# Patient Record
Sex: Female | Born: 1990 | Race: Black or African American | Hispanic: No | Marital: Single | Smoking: Never smoker
Health system: Southern US, Community
[De-identification: ages and names within clinical notes are randomized; demographics above are authoritative.]

## PROBLEM LIST (undated history)

## (undated) HISTORY — PX: TONSILLECTOMY: SUR1361

---

## 2010-11-17 ENCOUNTER — Emergency Department (HOSPITAL_COMMUNITY)
Admission: EM | Admit: 2010-11-17 | Discharge: 2010-11-17 | Payer: Self-pay | Source: Home / Self Care | Admitting: Family Medicine

## 2011-09-09 ENCOUNTER — Encounter: Payer: Self-pay | Admitting: Emergency Medicine

## 2011-09-09 ENCOUNTER — Emergency Department (INDEPENDENT_AMBULATORY_CARE_PROVIDER_SITE_OTHER)
Admission: EM | Admit: 2011-09-09 | Discharge: 2011-09-09 | Disposition: A | Discharge status: Home / Self Care | Payer: BC Managed Care – PPO | Source: Home / Self Care | Attending: Family Medicine | Admitting: Family Medicine

## 2011-09-09 DIAGNOSIS — N760 Acute vaginitis: Secondary | ICD-10-CM

## 2011-09-09 LAB — POCT URINALYSIS DIP (DEVICE)
Bilirubin Urine: NEGATIVE
Glucose, UA: NEGATIVE mg/dL
Hgb urine dipstick: NEGATIVE
Ketones, ur: NEGATIVE mg/dL
Leukocytes, UA: NEGATIVE
Nitrite: NEGATIVE
Protein, ur: 30 mg/dL — AB
Specific Gravity, Urine: 1.025 (ref 1.005–1.030)
Urobilinogen, UA: 0.2 mg/dL (ref 0.0–1.0)
pH: 7 (ref 5.0–8.0)

## 2011-09-09 LAB — POCT PREGNANCY, URINE: Preg Test, Ur: NEGATIVE

## 2011-09-09 LAB — WET PREP, GENITAL
Trich, Wet Prep: NONE SEEN
Yeast Wet Prep HPF POC: NONE SEEN

## 2011-09-09 MED ORDER — METRONIDAZOLE 500 MG PO TABS: 500.0000 mg | ORAL_TABLET | Freq: Two times a day (BID) | ORAL | Status: AC

## 2011-09-09 NOTE — ED Notes (Signed)
Pt here with c/o yeast infection sx with right side labia cluster of pink bumps that started yesterday.c/o irritation but no pain.pt tried to pop pimple with clear drainage.lmp 2 wks ago.

## 2011-09-09 NOTE — ED Provider Notes (Signed)
History     CSN: [id number] Arrival date & time: 09/09/2011  1:31 PM   First MD Initiated Contact with Patient 09/09/11 1337      Chief Complaint  Patient presents with  . Vaginitis  . Blister    (Consider location/radiation/quality/duration/timing/severity/associated sxs/prior treatment) Patient is a 20 y.o. female presenting with vaginal discharge. The history is provided by the patient.  Vaginal Discharge This is a new problem. The current episode started yesterday. The problem occurs constantly.  Di scribed as thick and white. Also reports some tiny blister. States she wore a thong yesterday and then noted tiny blisters with itching. No prior episodes. No tx pta. No recent antibiotics.   History reviewed. No pertinent past medical history.  History reviewed. No pertinent past surgical history.  History reviewed. No pertinent family history.  History  Substance Use Topics  . Smoking status: Never Smoker   . Smokeless tobacco: Not on file  . Alcohol Use: Yes    OB History    Grav Para Term Preterm Abortions TAB SAB Ect Mult Living                  Review of Systems  Constitutional: Negative.   HENT: Negative.   Respiratory: Negative.   Cardiovascular: Negative.   Gastrointestinal: Negative.   Genitourinary: Positive for vaginal discharge.    Allergies  Review of patient's allergies indicates no known allergies.  Home Medications   Current Outpatient Rx  Name Route Sig Dispense Refill  . NORGESTIMATE-ETH ESTRADIOL 0.25-35 MG-MCG PO TABS Oral Take 1 tablet by mouth daily.      Marland Kitchen METRONIDAZOLE 500 MG PO TABS Oral Take 1 tablet (500 mg total) by mouth 2 (two) times daily. 14 tablet 0    BP 128/84  Pulse 84  Temp(Src) 98.2 F (36.8 C) (Oral)  Resp 18  SpO2 98%  LMP 08/26/2011  Physical Exam  Nursing note and vitals reviewed. Constitutional: She appears well-developed and well-nourished. No distress.  Cardiovascular: Normal rate.   Pulmonary/Chest:  Effort normal.  Genitourinary:       Pelvic exam with female nursing personal Noreene Larsson assisting reveals very tiny lesion if any. No erythema. Thick discharge. Samples collected. No cmt.     ED Course  Procedures (including critical care time)  Labs Reviewed  POCT URINALYSIS DIP (DEVICE) - Abnormal; Notable for the following:    Protein, ur 30 (*)    All other components within normal limits  GC/CHLAMYDIA PROBE AMP, GENITAL  WET PREP, GENITAL  HERPES SIMPLEX VIRUS CULTURE  POCT PREGNANCY, URINE  POCT URINALYSIS DIPSTICK   No results found.   1. Vaginitis       MDM          Randa Spike, MD 09/09/11 [phone]

## 2011-09-10 LAB — GC/CHLAMYDIA PROBE AMP, GENITAL
Chlamydia, DNA Probe: NEGATIVE
GC Probe Amp, Genital: NEGATIVE

## 2011-09-13 ENCOUNTER — Telehealth (HOSPITAL_COMMUNITY): Payer: Self-pay | Admitting: Family Medicine

## 2011-09-13 LAB — HERPES SIMPLEX VIRUS CULTURE: Culture: DETECTED

## 2011-09-13 MED ORDER — ACYCLOVIR 400 MG PO TABS: 400.0000 mg | ORAL_TABLET | Freq: Every day | ORAL | Status: AC

## 2011-09-13 NOTE — ED Notes (Addendum)
Pt. called for her lab results. Pt. verified x 2 and given results.  She asked what about the Herpes?  It resulted @ 1121 after labs printed this AM. Pt. given result.  Reviewed medications and pt. needs treatment.  Order obtained from Dr. Juanetta Gosling for Acyclovir.  See telephone encounter for complete order, pt. instructions where Rx. was called. Vassie Moselle 09/13/2011

## 2011-09-25 ENCOUNTER — Emergency Department (HOSPITAL_COMMUNITY)
Admission: EM | Admit: 2011-09-25 | Discharge: 2011-09-25 | Disposition: A | Discharge status: Home / Self Care | Payer: BC Managed Care – PPO | Source: Home / Self Care

## 2013-02-15 ENCOUNTER — Encounter (HOSPITAL_COMMUNITY): Payer: Self-pay | Admitting: Emergency Medicine

## 2013-02-15 ENCOUNTER — Emergency Department (HOSPITAL_COMMUNITY)
Admission: EM | Admit: 2013-02-15 | Discharge: 2013-02-15 | Disposition: A | Discharge status: Home / Self Care | Payer: BC Managed Care – PPO | Source: Home / Self Care | Attending: Emergency Medicine | Admitting: Emergency Medicine

## 2013-02-15 DIAGNOSIS — J039 Acute tonsillitis, unspecified: Secondary | ICD-10-CM

## 2013-02-15 LAB — POCT RAPID STREP A: Streptococcus, Group A Screen (Direct): NEGATIVE

## 2013-02-15 MED ORDER — PENICILLIN V POTASSIUM 500 MG PO TABS: 500.0000 mg | ORAL_TABLET | Freq: Three times a day (TID) | ORAL | Status: AC

## 2013-02-15 NOTE — ED Provider Notes (Signed)
Chief Complaint:   Chief Complaint  Patient presents with  . Sore Throat    left side of throat there is a white patch pt states "feels like something is stuck in throat"    History of Present Illness:   Misty Cameron is a 22 year old female who has had a one-week history of sore throat on the left with a white spot, nasal congestion, rhinorrhea, pain in her right ear, swollen glands, and a slight cough. She denies any fever, chills, headache, GI symptoms, or history of exposure to strep or to mono. She [facility] and had testing for STDs. She does not think she was tested for strep. She was told they could not treat her therefore anything.  Review of Systems:  Other than as noted above, the patient denies any of the following symptoms. Systemic:  No fever, chills, sweats, fatigue, myalgias, headache, or anorexia. Eye:  No redness, pain or drainage. ENT:  No earache, ear congestion, nasal congestion, sneezing, rhinorrhea, sinus pressure, sinus pain, or post nasal drip. Lungs:  No cough, sputum production, wheezing, shortness of breath, or chest pain. GI:  No abdominal pain, nausea, vomiting, or diarrhea. Skin:  No rash or itching.  PMFSH:  Past medical history, family history, social history, meds, allergies, and nurse's notes were reviewed.  There is no known exposure to strep or mono.  No prior history of step or mono.  The patient denies use of tobacco.   Physical Exam:   Vital signs:  BP 110/74  Pulse 55  Temp(Src) 98.5 F (36.9 C) (Oral)  Resp 16  LMP 02/15/2013 General:  Alert, in no distress. Eye:  No conjunctival injection or drainage. Lids were normal. ENT:  TMs and canals were normal, without erythema or inflammation.  Nasal mucosa was clear and uncongested, without drainage.  Mucous membranes were moist.  Exam of pharynx reveals the left tonsil to be enlarged and erythematous with one spot of white exudate.  There were no oral ulcerations or  lesions. Neck:  Supple, no adenopathy, tenderness or mass. Lungs:  No respiratory distress.  Lungs were clear to auscultation, without wheezes, rales or rhonchi.  Breath sounds were clear and equal bilaterally.  Heart:  Regular rhythm, without gallops, murmers or rubs. Skin:  Clear, warm, and dry, without rash or lesions.  Labs:   Results for orders placed during the hospital encounter of 02/15/13  POCT RAPID STREP A ([facility] ONLY)      Result Value Range   Streptococcus, Group A Screen (Direct) NEGATIVE  NEGATIVE   She also had a Monospot which was negative.  Assessment:  The encounter diagnosis was Tonsillitis.  No laboratory evidence of strep or mono, but symptoms are suspicious for strep, so will treat as such, return if no better in 3 or 4 days.  Plan:   1.  The following meds were prescribed:   Discharge Medication List as of 02/15/2013  5:37 PM    START taking these medications   Details  penicillin v potassium (VEETID) 500 MG tablet Take 1 tablet (500 mg total) by mouth 3 (three) times daily., Starting 02/15/2013, Until Discontinued, Normal       2.  The patient was instructed in symptomatic care including hot saline gargles, throat lozenges, infectious precautions, and need to trade out toothbrush. Handouts were given. 3.  The patient was told to return if becoming worse in any way, if no better in 3 or 4 days, and given  some red flag symptoms such as fever or difficulty breathing or swallowing or worsening pain that would indicate earlier return.    Reuben Likes, MD 02/15/13 [phone]

## 2013-02-15 NOTE — ED Notes (Addendum)
Pt c/o sore throat that mainly hurts on the left side. There is a white patch noted on the left side of the throat. Pt states "feels like something is stuck"  Pt has been doing salt water gargles with no relief. Pt was recently seen at health department and a culture was done but has not come back yet.   Pt denies fever, n/v/d

## 2013-02-16 LAB — POCT INFECTIOUS MONO SCREEN: Mono Screen: NEGATIVE

## 2013-08-21 ENCOUNTER — Emergency Department (HOSPITAL_COMMUNITY)
Admission: EM | Admit: 2013-08-21 | Discharge: 2013-08-21 | Disposition: A | Discharge status: Home / Self Care | Payer: BC Managed Care – PPO | Source: Home / Self Care | Attending: Family Medicine | Admitting: Family Medicine

## 2013-08-21 ENCOUNTER — Encounter (HOSPITAL_COMMUNITY): Payer: Self-pay | Admitting: Emergency Medicine

## 2013-08-21 DIAGNOSIS — J029 Acute pharyngitis, unspecified: Secondary | ICD-10-CM

## 2013-08-21 DIAGNOSIS — J069 Acute upper respiratory infection, unspecified: Secondary | ICD-10-CM

## 2013-08-21 LAB — POCT INFECTIOUS MONO SCREEN: Mono Screen: NEGATIVE

## 2013-08-21 LAB — POCT RAPID STREP A: Streptococcus, Group A Screen (Direct): NEGATIVE

## 2013-08-21 MED ORDER — CHLORPHENIRAMINE-PSE-IBUPROFEN 2-30-200 MG PO TABS: ORAL_TABLET | ORAL | Status: AC

## 2013-08-21 MED ORDER — METHYLPREDNISOLONE 4 MG PO KIT: PACK | ORAL | Status: AC

## 2013-08-21 NOTE — ED Notes (Signed)
C/o sore throat, lower neck soreness, productive cough with green sputum. otc meds taken no relief. Denies fever, n/v/d

## 2013-08-21 NOTE — ED Provider Notes (Signed)
CSN: [id number]     Arrival date & time 08/21/13  1122 History   First MD Initiated Contact with Patient 08/21/13 1307     Chief Complaint  Patient presents with  . Sore Throat    x 3 days. no relief with otc meds.    (Consider location/radiation/quality/duration/timing/severity/associated sxs/prior Treatment) HPI Comments: 22 year old female presents complaining of sore throat, fatigue, anterior neck soreness, and productive cough. This is been present for a few days, gradually worsening. She is also concerned because her uvula "looks like jelly." She wonders if this could possibly be an STD. She denies fever, chills, vaginal discharge, pelvic pain.  Patient is a 22 y.o. female presenting with pharyngitis.  Sore Throat Pertinent negatives include no chest pain, no abdominal pain and no shortness of breath.    History reviewed. No pertinent past medical history. Past Surgical History  Procedure Laterality Date  . Tonsillectomy     History reviewed. No pertinent family history. History  Substance Use Topics  . Smoking status: Never Smoker   . Smokeless tobacco: Not on file  . Alcohol Use: Yes   OB History   Grav Para Term Preterm Abortions TAB SAB Ect Mult Living                 Review of Systems  Constitutional: Negative for fever and chills.  HENT: Positive for ear pain (itching), rhinorrhea and sore throat. Negative for dental problem, sinus pressure and trouble swallowing.   Eyes: Negative for visual disturbance.  Respiratory: Positive for cough. Negative for shortness of breath.   Cardiovascular: Negative for chest pain, palpitations and leg swelling.  Gastrointestinal: Negative for nausea, vomiting and abdominal pain.  Endocrine: Negative for polydipsia and polyuria.  Genitourinary: Negative for dysuria, urgency and frequency.  Musculoskeletal: Positive for back pain (mid upper back). Negative for arthralgias and myalgias.  Skin: Negative for rash.  Neurological:  Negative for dizziness, weakness and light-headedness.    Allergies  Review of patient's allergies indicates no known allergies.  Home Medications   Current Outpatient Rx  Name  Route  Sig  Dispense  Refill  . Chlorpheniramine-PSE-Ibuprofen (ADVIL ALLERGY SINUS) 2-30-200 MG TABS      1-2 tabs PO Q4-6 hrs PRN   50 each   1   . methylPREDNISolone (MEDROL DOSEPAK) 4 MG tablet      follow package directions   21 tablet   0     Dispense as written.   . norgestimate-ethinyl estradiol (ORTHO-CYCLEN,SPRINTEC,PREVIFEM) 0.25-35 MG-MCG tablet   Oral   Take 1 tablet by mouth daily.           . penicillin v potassium (VEETID) 500 MG tablet   Oral   Take 1 tablet (500 mg total) by mouth 3 (three) times daily.   30 tablet   0    BP 110/73  Pulse 63  Temp(Src) 98.3 F (36.8 C) (Oral)  Resp 16  SpO2 100%  LMP 07/23/2013 Physical Exam  Nursing note and vitals reviewed. Constitutional: She is oriented to person, place, and time. Vital signs are normal. She appears well-developed and well-nourished. She appears ill. No distress.  HENT:  Head: Normocephalic and atraumatic.  Mouth/Throat: Uvula is midline. Uvula swelling present. Posterior oropharyngeal erythema present. No oropharyngeal exudate.  Eyes: EOM are normal. Pupils are equal, round, and reactive to light.  Neck: Normal range of motion. Neck supple.  Pulmonary/Chest: Effort normal. No respiratory distress.  Lymphadenopathy:    She has cervical adenopathy (right tonsillar).  Neurological: She is alert and oriented to person, place, and time. She has normal strength. Coordination normal.  Skin: Skin is warm and dry. No rash noted. She is not diaphoretic.  Psychiatric: She has a normal mood and affect. Judgment normal.    ED Course  Procedures (including critical care time) Labs Review Labs Reviewed  CULTURE, GROUP A STREP  POCT RAPID STREP A ([facility] ONLY)  POCT INFECTIOUS MONO SCREEN   Imaging Review No  results found.    MDM   1. URI (upper respiratory infection)   2. Pharyngitis, acute    Rapid strep is negative, this is most likely Viral pharyngitis. She expresses some concern about STDs in her throat do to some of her recent activities. I have offered her treatment for this assuming it is an STD but she declines at this time because she does not want to get an injection. if it does not get better she will come back. She declines any testing at this time.   Meds ordered this encounter  Medications  . methylPREDNISolone (MEDROL DOSEPAK) 4 MG tablet    Sig: follow package directions    Dispense:  21 tablet    Refill:  0    Order Specific Question:  Supervising Provider    Answer:  Linna Hoff [phone]  . Chlorpheniramine-PSE-Ibuprofen (ADVIL ALLERGY SINUS) 2-30-200 MG TABS    Sig: 1-2 tabs PO Q4-6 hrs PRN    Dispense:  50 each    Refill:  1    Order Specific Question:  Supervising Provider    Answer:  Bradd Canary D [[id number]]     Graylon Good, PA-C 08/21/13 2043

## 2013-08-23 LAB — CULTURE, GROUP A STREP

## 2013-08-23 NOTE — ED Provider Notes (Signed)
Medical screening examination/treatment/procedure(s) were performed by a resident physician or non-physician practitioner and as the supervising physician I was immediately available for consultation/collaboration.  Evan Corey, MD   Evan S Corey, MD 08/23/13 0755 

## 2013-11-30 ENCOUNTER — Encounter (HOSPITAL_COMMUNITY): Payer: Self-pay | Admitting: Emergency Medicine

## 2013-11-30 ENCOUNTER — Other Ambulatory Visit (HOSPITAL_COMMUNITY)
Admission: RE | Admit: 2013-11-30 | Discharge: 2013-11-30 | Disposition: A | Discharge status: Home / Self Care | Payer: BC Managed Care – PPO | Source: Ambulatory Visit | Attending: Family Medicine | Admitting: Family Medicine

## 2013-11-30 ENCOUNTER — Emergency Department (INDEPENDENT_AMBULATORY_CARE_PROVIDER_SITE_OTHER)
Admission: EM | Admit: 2013-11-30 | Discharge: 2013-11-30 | Disposition: A | Discharge status: Home / Self Care | Payer: BC Managed Care – PPO | Source: Home / Self Care | Attending: Family Medicine | Admitting: Family Medicine

## 2013-11-30 DIAGNOSIS — B351 Tinea unguium: Secondary | ICD-10-CM

## 2013-11-30 DIAGNOSIS — Z113 Encounter for screening for infections with a predominantly sexual mode of transmission: Secondary | ICD-10-CM | POA: Insufficient documentation

## 2013-11-30 DIAGNOSIS — N760 Acute vaginitis: Secondary | ICD-10-CM | POA: Insufficient documentation

## 2013-11-30 DIAGNOSIS — N898 Other specified noninflammatory disorders of vagina: Secondary | ICD-10-CM

## 2013-11-30 LAB — POCT URINALYSIS DIP (DEVICE)
Bilirubin Urine: NEGATIVE
Glucose, UA: NEGATIVE mg/dL
Hgb urine dipstick: NEGATIVE
Ketones, ur: NEGATIVE mg/dL
Leukocytes, UA: NEGATIVE
Nitrite: NEGATIVE
Protein, ur: NEGATIVE mg/dL
Specific Gravity, Urine: >=1.03 (ref 1.005–1.030)
Urobilinogen, UA: 0.2 mg/dL (ref 0.0–1.0)
pH: 7 (ref 5.0–8.0)

## 2013-11-30 LAB — POCT PREGNANCY, URINE: Preg Test, Ur: NEGATIVE

## 2013-11-30 MED ORDER — TERBINAFINE HCL 250 MG PO TABS: 250.0000 mg | ORAL_TABLET | Freq: Every day | ORAL | Status: AC

## 2013-11-30 MED ORDER — METRONIDAZOLE 500 MG PO TABS: 500.0000 mg | ORAL_TABLET | Freq: Two times a day (BID) | ORAL | Status: AC

## 2013-11-30 NOTE — ED Provider Notes (Signed)
CSN: [id number]     Arrival date & time 11/30/13  0820 History   First MD Initiated Contact with Patient 11/30/13 [phone]     Chief Complaint  Patient presents with  . Vaginal Discharge    Patient is a 23 y.o. female presenting with vaginal discharge. The history is provided by the patient.  Vaginal Discharge Quality:  Thick and malodorous Severity:  Moderate Onset quality:  Gradual Duration:  2 days Timing:  Constant Progression:  Worsening Chronicity:  Recurrent Relieved by:  None tried Ineffective treatments:  None tried Associated symptoms: no abdominal pain, no dysuria, no fever, no genital lesions, no urinary frequency, no vaginal itching and no vomiting   Risk factors: STI   Pt presents w/ c/o 2 day h/o malodorous vag d/c. Admits to unprotected intercourse w/ one partner. LMP 11/18/2013 and was normal. Pt has h/o chlamydia, genital herpes and recurrent BV. She has been concerned because the "fishy"odor is embarrassing but denies any vag pain or lower abd pain. Denies fever. Pt is on OC's. Denies N/V/D.   History reviewed. No pertinent past medical history. Past Surgical History  Procedure Laterality Date  . Tonsillectomy     History reviewed. No pertinent family history. History  Substance Use Topics  . Smoking status: Never Smoker   . Smokeless tobacco: Not on file  . Alcohol Use: Yes   OB History   Grav Para Term Preterm Abortions TAB SAB Ect Mult Living                 Review of Systems  Constitutional: Negative for fever.  Gastrointestinal: Negative for vomiting and abdominal pain.  Genitourinary: Positive for vaginal discharge. Negative for dysuria.  All other systems reviewed and are negative.    Allergies  Review of patient's allergies indicates no known allergies.  Home Medications   Current Outpatient Rx  Name  Route  Sig  Dispense  Refill  . norgestimate-ethinyl estradiol (ORTHO-CYCLEN,SPRINTEC,PREVIFEM) 0.25-35 MG-MCG tablet   Oral   Take 1 tablet  by mouth daily.           . Chlorpheniramine-PSE-Ibuprofen (ADVIL ALLERGY SINUS) 2-30-200 MG TABS      1-2 tabs PO Q4-6 hrs PRN   50 each   1   . methylPREDNISolone (MEDROL DOSEPAK) 4 MG tablet      follow package directions   21 tablet   0     Dispense as written.   . metroNIDAZOLE (FLAGYL) 500 MG tablet   Oral   Take 1 tablet (500 mg total) by mouth 2 (two) times daily.   14 tablet   0   . penicillin v potassium (VEETID) 500 MG tablet   Oral   Take 1 tablet (500 mg total) by mouth 3 (three) times daily.   30 tablet   0   . terbinafine (LAMISIL) 250 MG tablet   Oral   Take 1 tablet (250 mg total) by mouth daily. For 12 weeks   180 tablet   1    BP 100/54  Pulse 58  Temp(Src) 98.2 F (36.8 C) (Oral)  Resp 16  SpO2 98% Physical Exam  Constitutional: She is oriented to person, place, and time. She appears well-developed and well-nourished.  HENT:  Head: Normocephalic and atraumatic.  Eyes: Conjunctivae are normal.  Neck: Neck supple.  Cardiovascular: Normal rate.   Pulmonary/Chest: Effort normal.  Abdominal: Hernia confirmed negative in the right inguinal area and confirmed negative in the left inguinal area.  Genitourinary: Vagina normal  and uterus normal. No labial fusion. There is no rash, tenderness, lesion or injury on the right labia. There is no rash, tenderness, lesion or injury on the left labia. Cervix exhibits no motion tenderness, no discharge and no friability. Right adnexum displays no mass, no tenderness and no fullness. Left adnexum displays no mass, no tenderness and no fullness.  Pelvic exam completely unremarkable. Do not appreciate malodorous discharge.  Musculoskeletal: Normal range of motion.  Lymphadenopathy:       Right: No inguinal adenopathy present.       Left: No inguinal adenopathy present.  Neurological: She is alert and oriented to person, place, and time.  Skin: Skin is warm and dry.  Psychiatric: She has a normal mood and  affect.    ED Course  Pelvic exam Date/Time: 11/30/2013 10:30 AM Performed by: Jeryl Columbia Authorized by: Jeryl Columbia Consent: Verbal consent obtained. written consent obtained. Risks and benefits: risks, benefits and alternatives were discussed Consent given by: patient Patient understanding: patient states understanding of the procedure being performed Required items: required blood products, implants, devices, and special equipment available Patient identity confirmed: verbally with patient and arm band Local anesthesia used: no Patient sedated: no Patient tolerance: Patient tolerated the procedure well with no immediate complications.   (including critical care time) Labs Review Labs Reviewed  POCT PREGNANCY, URINE  POCT URINALYSIS DIP (DEVICE)  CERVICOVAGINAL ANCILLARY ONLY   Imaging Review No results found.    MDM   1. Toenail fungus   2. Vaginal discharge    2 day h/o malodorous vag d/c that has not been associated w/ other symptoms. Pt has h/o recurrent BV as well Chlamydia and genital herspes. Pelvic exam is completely unremarkable. Will treat w/ Flagyl BID x 7 days d/t h/o recurrent BV and f/u GC/Chlamydia and AFFIRM testing. 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 Will treat w/ Lamisil 250 QD x 12 weeks and provide dermatology/[facility] referrals for f/u as indicated. Pt to f/u w/ her GYN in [place] as needed.    Jeryl Columbia, NP 11/30/13 [phone]

## 2013-11-30 NOTE — ED Notes (Signed)
C/o odor vaginal area for past few days

## 2013-11-30 NOTE — ED Notes (Signed)
Call back number verified.  

## 2013-11-30 NOTE — Discharge Instructions (Signed)
Take the Flagyl as directed and do not consume alcohol while you are on this medication. Take the Lamisil daily for 12 weeks. If your toenail fungus does not resolve arrnage follow up with a dermatologist or a pediatrist.

## 2013-11-30 NOTE — ED Notes (Addendum)
Affirm: Candida and Trich neg., Gardnerella pos. Pt. adequately treated with Flagyl. Vassie MoselleYork, Suzanne M 11/30/2013 GC/Chlamydia neg. 12/03/2013

## 2013-12-05 NOTE — ED Provider Notes (Signed)
Medical screening examination/treatment/procedure(s) were performed by resident physician or non-physician practitioner and as supervising physician I was immediately available for consultation/collaboration.   KINDL,JAMES DOUGLAS MD.   James D Kindl, MD 12/05/13 1807 

## 2014-02-28 ENCOUNTER — Encounter (HOSPITAL_COMMUNITY): Payer: Self-pay | Admitting: Emergency Medicine

## 2014-02-28 ENCOUNTER — Emergency Department (HOSPITAL_COMMUNITY)
Admission: EM | Admit: 2014-02-28 | Discharge: 2014-02-28 | Disposition: A | Discharge status: Home / Self Care | Payer: BC Managed Care – PPO | Source: Home / Self Care | Attending: Family Medicine | Admitting: Family Medicine

## 2014-02-28 ENCOUNTER — Emergency Department (HOSPITAL_COMMUNITY): Payer: BC Managed Care – PPO

## 2014-02-28 ENCOUNTER — Emergency Department (INDEPENDENT_AMBULATORY_CARE_PROVIDER_SITE_OTHER): Payer: BC Managed Care – PPO

## 2014-02-28 DIAGNOSIS — S91109A Unspecified open wound of unspecified toe(s) without damage to nail, initial encounter: Secondary | ICD-10-CM

## 2014-02-28 DIAGNOSIS — Y9229 Other specified public building as the place of occurrence of the external cause: Secondary | ICD-10-CM

## 2014-02-28 DIAGNOSIS — W268XXA Contact with other sharp object(s), not elsewhere classified, initial encounter: Secondary | ICD-10-CM

## 2014-02-28 DIAGNOSIS — M795 Residual foreign body in soft tissue: Secondary | ICD-10-CM

## 2014-02-28 DIAGNOSIS — H9209 Otalgia, unspecified ear: Secondary | ICD-10-CM

## 2014-02-28 DIAGNOSIS — S91309A Unspecified open wound, unspecified foot, initial encounter: Secondary | ICD-10-CM

## 2014-02-28 DIAGNOSIS — S91319A Laceration without foreign body, unspecified foot, initial encounter: Secondary | ICD-10-CM

## 2014-02-28 DIAGNOSIS — S91119A Laceration without foreign body of unspecified toe without damage to nail, initial encounter: Secondary | ICD-10-CM

## 2014-02-28 DIAGNOSIS — Z23 Encounter for immunization: Secondary | ICD-10-CM

## 2014-02-28 LAB — POCT PREGNANCY, URINE: Preg Test, Ur: NEGATIVE

## 2014-02-28 MED ORDER — DOXYCYCLINE HYCLATE 100 MG PO CAPS: 100.0000 mg | ORAL_CAPSULE | Freq: Two times a day (BID) | ORAL | Status: AC

## 2014-02-28 MED ORDER — HYDROCODONE-ACETAMINOPHEN 5-325 MG PO TABS: 1.0000 | ORAL_TABLET | ORAL | Status: AC | PRN

## 2014-02-28 MED ORDER — TRAMADOL HCL 50 MG PO TABS: 50.0000 mg | ORAL_TABLET | Freq: Four times a day (QID) | ORAL | Status: AC | PRN

## 2014-02-28 MED ORDER — CEPHALEXIN 500 MG PO CAPS: 500.0000 mg | ORAL_CAPSULE | Freq: Four times a day (QID) | ORAL | Status: AC

## 2014-02-28 MED ORDER — TETANUS-DIPHTH-ACELL PERTUSSIS 5-2.5-18.5 LF-MCG/0.5 IM SUSP
0.5000 mL | Freq: Once | INTRAMUSCULAR | Status: AC
  Administered 2014-02-28: 0.5 mL via INTRAMUSCULAR

## 2014-02-28 MED ORDER — ONDANSETRON 8 MG PO TBDP: 8.0000 mg | ORAL_TABLET | Freq: Three times a day (TID) | ORAL | Status: AC | PRN

## 2014-02-28 MED ORDER — TETANUS-DIPHTH-ACELL PERTUSSIS 5-2.5-18.5 LF-MCG/0.5 IM SUSP
INTRAMUSCULAR | Status: AC
Start: 2014-02-28 — End: 2014-02-28
  Filled 2014-02-28: qty 0.5
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
## 2014-02-28 NOTE — ED Notes (Signed)
Went to get patient for x-ray, CMA was performing ear wax removal

## 2014-02-28 NOTE — Discharge Instructions (Signed)
Not all of the foreign body in your foot was able to be removed today. I will prescribe you antibiotics to take for wound infection prophylaxis, perform warm soaks of her feet a few times daily to help your body push the rest of the pieces of glass out. Take ibuprofen, and you may take the prescription tramadol as needed for more pain control.  Laceration Care, Adult A laceration is a cut or lesion that goes through all layers of the skin and into the tissue just beneath the skin. TREATMENT  Some lacerations may not require closure. Some lacerations may not be able to be closed due to an increased risk of infection. It is important to see your caregiver as soon as possible after an injury to minimize the risk of infection and maximize the opportunity for successful closure. If closure is appropriate, pain medicines may be given, if needed. The wound will be cleaned to help prevent infection. Your caregiver will use stitches (sutures), staples, wound glue (adhesive), or skin adhesive strips to repair the laceration. These tools bring the skin edges together to allow for faster healing and a better cosmetic outcome. However, all wounds will heal with a scar. Once the wound has healed, scarring can be minimized by covering the wound with sunscreen during the day for 1 full year. HOME CARE INSTRUCTIONS  For sutures or staples:  Keep the wound clean and dry.  If you were given a bandage (dressing), you should change it at least once a day. Also, change the dressing if it becomes wet or dirty, or as directed by your caregiver.  Wash the wound with soap and water 2 times a day. Rinse the wound off with water to remove all soap. Pat the wound dry with a clean towel.  After cleaning, apply a thin layer of the antibiotic ointment as recommended by your caregiver. This will help prevent infection and keep the dressing from sticking.  You may shower as usual after the first 24 hours. Do not soak the wound in  water until the sutures are removed.  Only take over-the-counter or prescription medicines for pain, discomfort, or fever as directed by your caregiver.  Get your sutures or staples removed as directed by your caregiver. For skin adhesive strips:  Keep the wound clean and dry.  Do not get the skin adhesive strips wet. You may bathe carefully, using caution to keep the wound dry.  If the wound gets wet, pat it dry with a clean towel.  Skin adhesive strips will fall off on their own. You may trim the strips as the wound heals. Do not remove skin adhesive strips that are still stuck to the wound. They will fall off in time. For wound adhesive:  You may briefly wet your wound in the shower or bath. Do not soak or scrub the wound. Do not swim. Avoid periods of heavy perspiration until the skin adhesive has fallen off on its own. After showering or bathing, gently pat the wound dry with a clean towel.  Do not apply liquid medicine, cream medicine, or ointment medicine to your wound while the skin adhesive is in place. This may loosen the film before your wound is healed.  If a dressing is placed over the wound, be careful not to apply tape directly over the skin adhesive. This may cause the adhesive to be pulled off before the wound is healed.  Avoid prolonged exposure to sunlight or tanning lamps while the skin adhesive is in  place. Exposure to ultraviolet light in the first year will darken the scar.  The skin adhesive will usually remain in place for 5 to 10 days, then naturally fall off the skin. Do not pick at the adhesive film. You may need a tetanus shot if:  You cannot remember when you had your last tetanus shot.  You have never had a tetanus shot. If you get a tetanus shot, your arm may swell, get red, and feel warm to the touch. This is common and not a problem. If you need a tetanus shot and you choose not to have one, there is a rare chance of getting tetanus. Sickness from  tetanus can be serious. SEEK MEDICAL CARE IF:   You have redness, swelling, or increasing pain in the wound.  You see a red line that goes away from the wound.  You have yellowish-white fluid (pus) coming from the wound.  You have a fever.  You notice a bad smell coming from the wound or dressing.  Your wound breaks open before or after sutures have been removed.  You notice something coming out of the wound such as wood or glass.  Your wound is on your hand or foot and you cannot move a finger or toe. SEEK IMMEDIATE MEDICAL CARE IF:   Your pain is not controlled with prescribed medicine.  You have severe swelling around the wound causing pain and numbness or a change in color in your arm, hand, leg, or foot.  Your wound splits open and starts bleeding.  You have worsening numbness, weakness, or loss of function of any joint around or beyond the wound.  You develop painful lumps near the wound or on the skin anywhere on your body. MAKE SURE YOU:   Understand these instructions.  Will watch your condition.  Will get help right away if you are not doing well or get worse. Document Released: 10/11/2005 Document Revised: 01/03/2012 Document Reviewed: 04/06/2011 [facility] Patient Information 2014 [place], [place].
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
## 2014-03-01 NOTE — ED Provider Notes (Signed)
Medical screening examination/treatment/procedure(s) were performed by a resident physician or non-physician practitioner and as the supervising physician I was immediately available for consultation/collaboration.  Evan Corey, MD    Evan S Corey, MD 03/01/14 0753 

## 2014-03-01 NOTE — ED Notes (Signed)
[facility] when this writer had a chance to return calls from answering machine

## 2015-06-29 IMAGING — CR DG TOE GREAT 2+V*L*
2 series · 2 of 2 positions shown · non-contrast
Comparison: 02/28/2014

CLINICAL DATA: Possible foreign body

EXAM:
LEFT GREAT TOE

[view not recorded (1 of 2)]
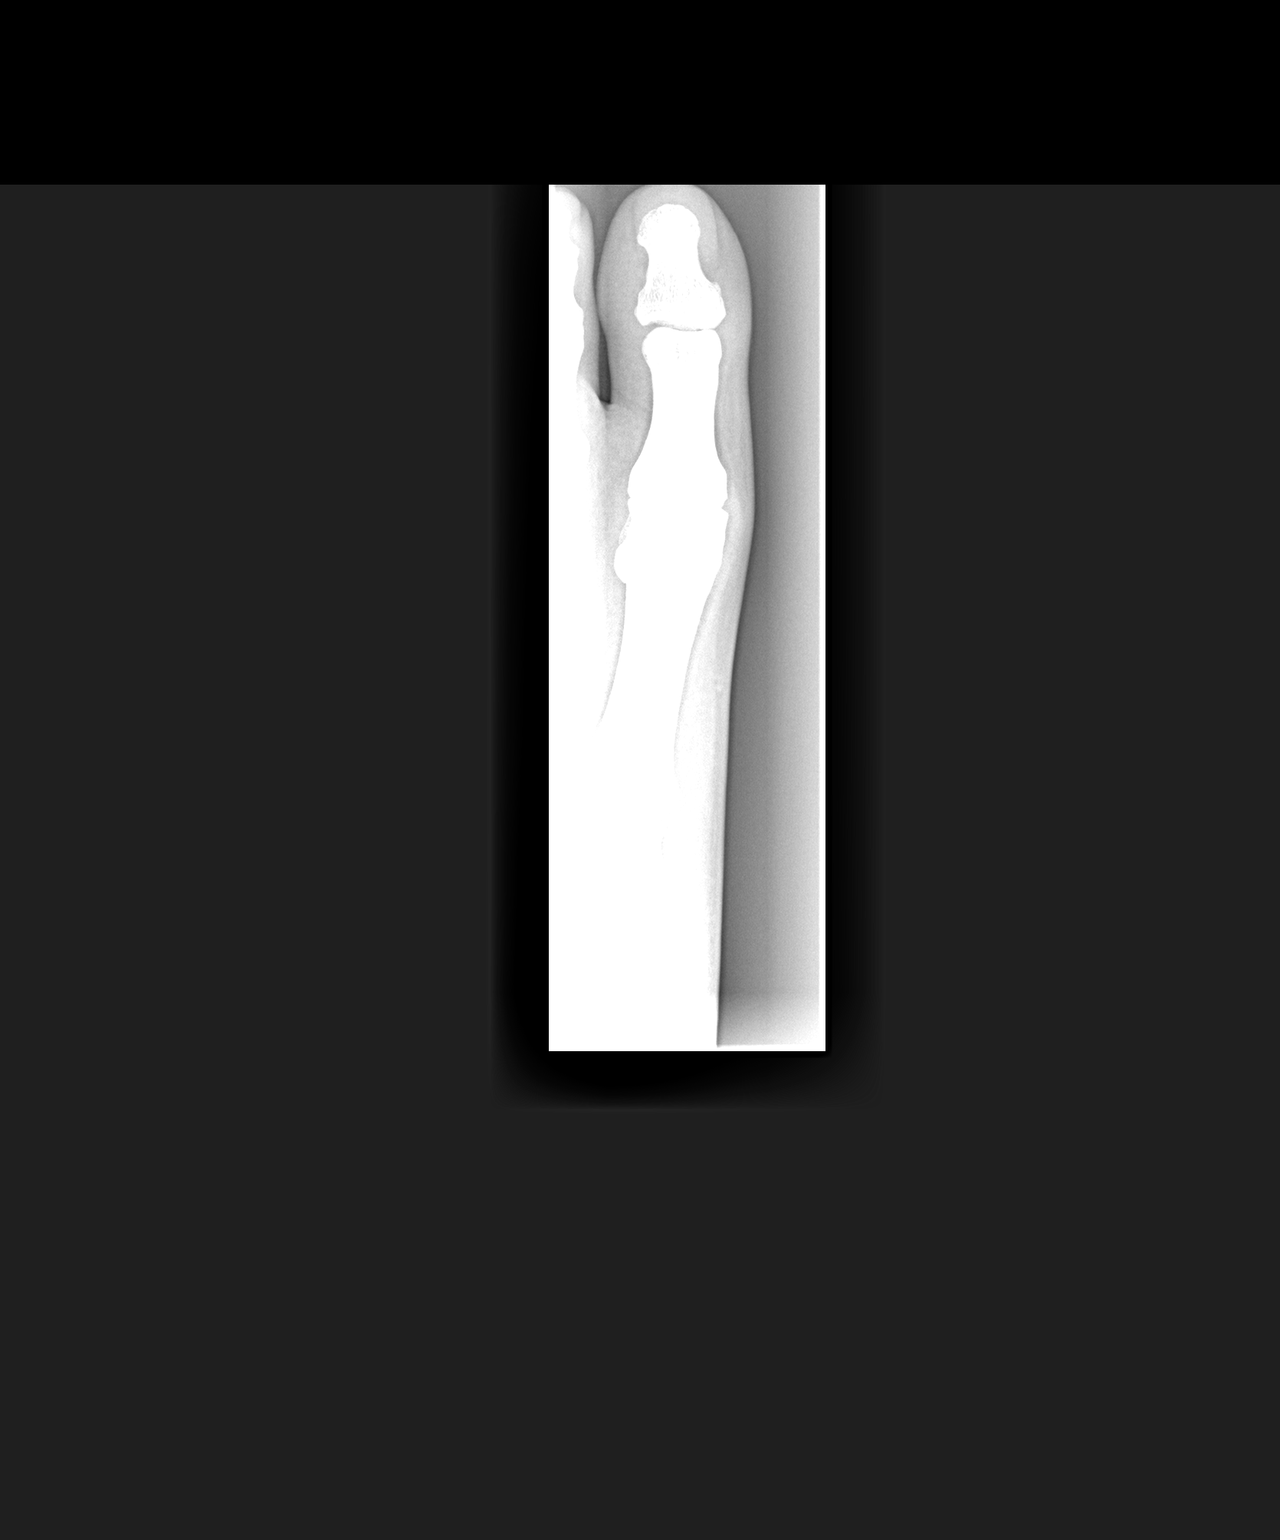

[view not recorded (2 of 2)]
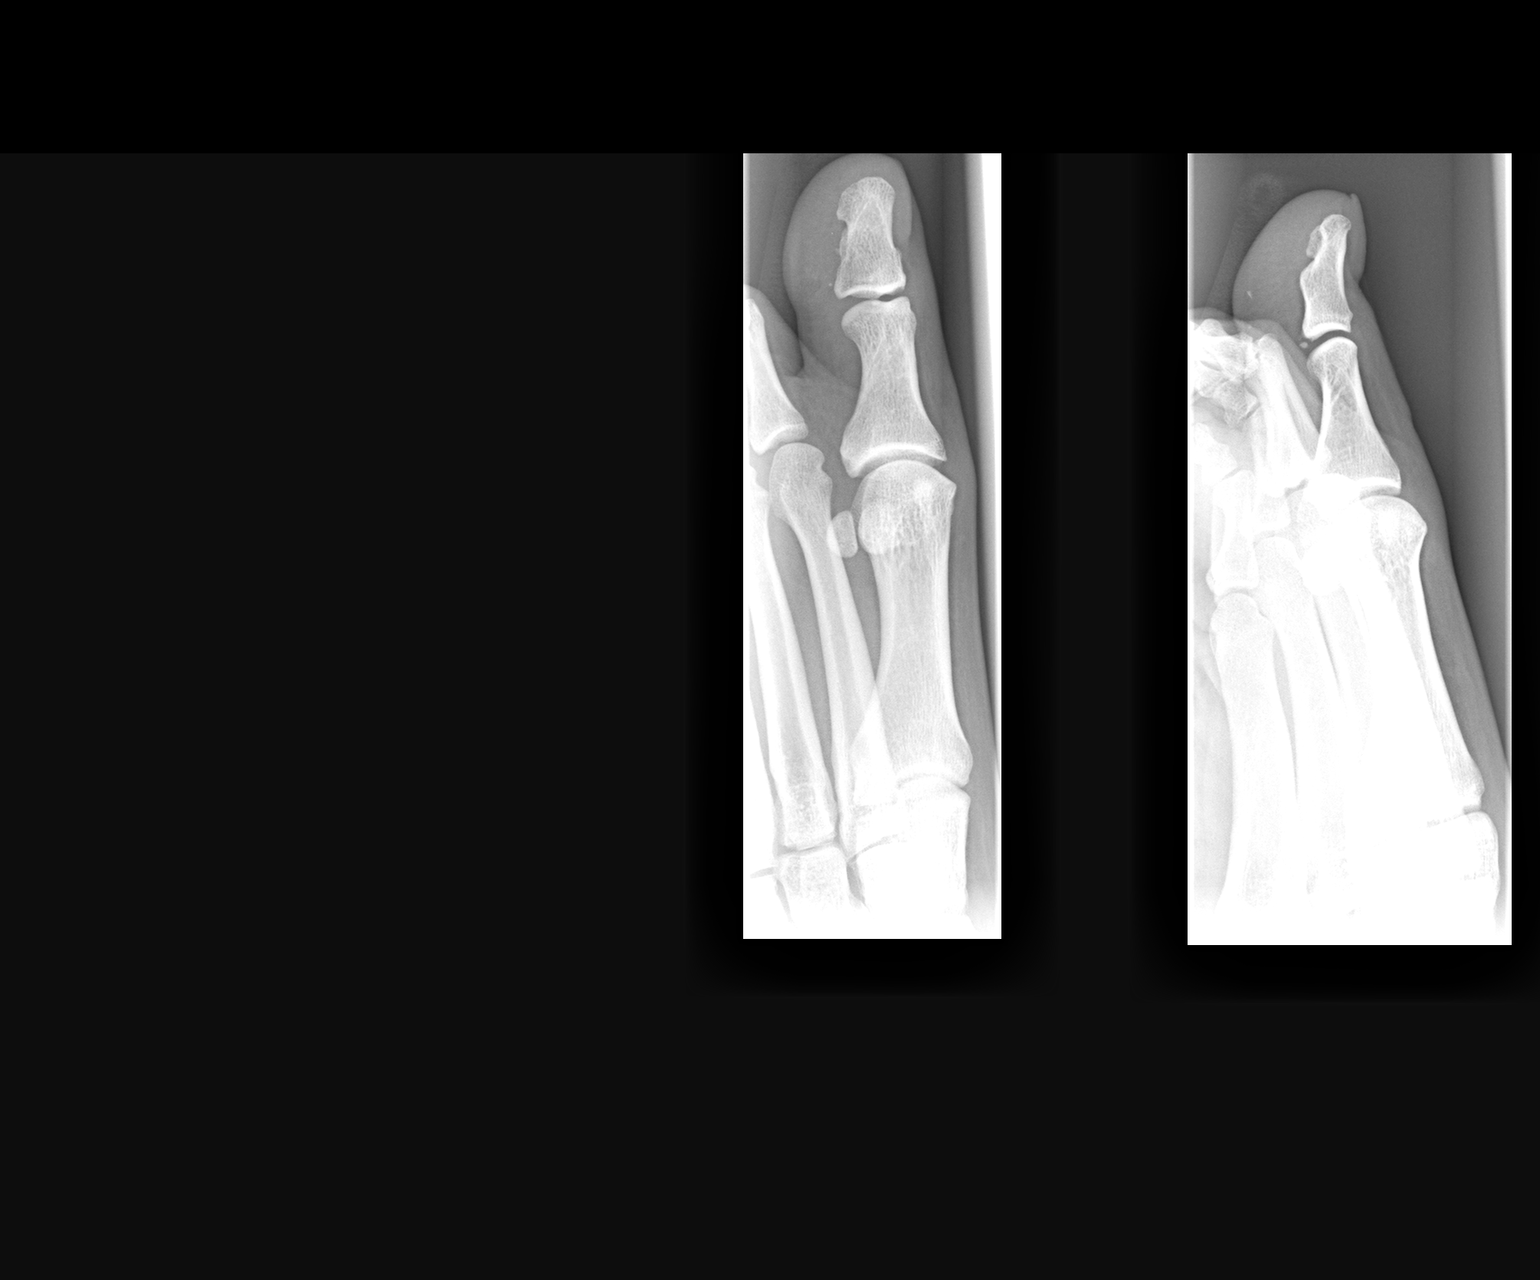

[2 of 2 positions shown; findings below may reference images not displayed]

FINDINGS: Persistent tiny triangular radiopaque foreign body along the left
first toe plantar surface as before. Normal alignment. Preserved
joint spaces. No fracture evident.
IMPRESSION: Persistent tiny radiopaque foreign body left great toe plantar
surface.
